# Patient Record
Sex: Male | Born: 1945 | Race: Black or African American | Hispanic: No | Marital: Married | Smoking: Never smoker
Health system: Southern US, Community
[De-identification: ages and names within clinical notes are randomized; demographics above are authoritative.]

## PROBLEM LIST (undated history)

## (undated) DIAGNOSIS — I10 Essential (primary) hypertension: Secondary | ICD-10-CM

## (undated) DIAGNOSIS — G4730 Sleep apnea, unspecified: Secondary | ICD-10-CM

## (undated) HISTORY — DX: Essential (primary) hypertension: I10

## (undated) HISTORY — DX: Sleep apnea, unspecified: G47.30

---

## 2007-05-20 ENCOUNTER — Ambulatory Visit (HOSPITAL_BASED_OUTPATIENT_CLINIC_OR_DEPARTMENT_OTHER): Admission: RE | Admit: 2007-05-20 | Discharge: 2007-05-20 | Payer: Self-pay | Admitting: Internal Medicine

## 2007-05-24 ENCOUNTER — Ambulatory Visit: Payer: Self-pay | Admitting: Internal Medicine

## 2008-03-23 ENCOUNTER — Ambulatory Visit (HOSPITAL_COMMUNITY): Admission: RE | Admit: 2008-03-23 | Discharge: 2008-03-24 | Payer: Self-pay | Admitting: Otolaryngology

## 2010-10-23 NOTE — Op Note (Signed)
NAME:  Mark Charles, Mark Charles                   ACCOUNT NO.:  [id number]   MEDICAL RECORD NO.:  [id number]          PATIENT TYPE:  OIB   LOCATION:  3307                         FACILITY:  [facility]   PHYSICIAN:  Kinnie Scales. Annalee Genta, M.D.DATE OF BIRTH:  [birth year]   DATE OF PROCEDURE:  03/23/2008  DATE OF DISCHARGE:                               OPERATIVE REPORT   PREOPERATIVE DIAGNOSES:  1. 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  2. Turbinate hypertrophy.  3. Severe obstructive sleep apnea.   POSTOPERATIVE DIAGNOSES:  1. 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  2. Turbinate hypertrophy.  3. Severe obstructive sleep apnea.   INDICATIONS FOR SURGERY:  1. 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  2. Turbinate hypertrophy.  3. Severe obstructive sleep apnea.   SURGICAL PROCEDURES:  1. Nasal septoplasty.  2. Bilateral inferior turbinate reduction.   SURGEON:  Kinnie Scales. Annalee Genta, MD   ANESTHESIA:  General endotracheal.   COMPLICATIONS:  None.   ESTIMATED BLOOD LOSS:  Approximately 50 mL.   The patient was transferred from the operating room to the recovery room  in stable condition.   BRIEF HISTORY:  The patient is a 65 year old black male who is referred  for evaluation of progressive symptoms of nasal airway obstruction.  The  patient has severe obstructive sleep apnea and attempts to wear CPAP on  a frequent basis.  Because of his nasal airway, he is unable to  adequately tolerate CPAP.  He complains of progressive symptoms of nasal  airway congestion and obstruction.  Examination in the office revealed a  severely deviated septum with left inferior septal spurring and large  turbinate hypertrophy.  Given his history and physical examination, I  recommended that he undertake septoplasty and bilateral turbinate  reduction under general anesthesia as an outpatient at [facility] OR.  With the patient's sleep apnea diagnosis, he was  scheduled for overnight observation in the Step-Down Unit for monitoring  of sleep apnea complications.  The patient understood and concurred with  our plan for surgery, which was scheduled as above.   PROCEDURE:  The patient was brought to the operating room on March 23, 2008, and placed in supine position on the operating table.  General  endotracheal anesthesia was established without difficulty.  When the  patient was adequately anesthetized, nose was examined and then injected  with a total of 7 mL of 1% lidocaine and 1:100,000 solution epinephrine  was injected in a submucosal fashion along the nasal septum and inferior  turbinates bilaterally.  The patient's nose was then packed with Afrin-  soaked cottonoid pledgets, and he was prepped and draped and positioned  on the operating table.   Procedure was begun by creating a right anterior hemitransfixion  incision.  This was carried through the mucosa underlying submucosa, and  a mucoperichondrial flap was elevated along the right-hand side.  Deviated bone and cartilage were crossed the midline, and a  mucoperiosteal flap was elevated along the patient's left-hand side.  With the mid septal cartilage freed of the overlying soft tissue,  it was  removed and preserved posterior bony septum and was partially resected  to bring the septum at the midline.  There was a large inferior septal  spur on the left-hand side, which was elevated using a 4-mm osteotome.  Overlying mucosa was preserved.  Anterior dorsal and columellar  cartilage was not deviated and this left intact.  The removed mid septal  cartilage was morcellized and returned to the mucoperichondrial pocket  at the conclusion of the procedure.  Mucoperichondrial flaps  reapproximated with a 4-0 gut suture on a Keith needle in a horizontal  mattress fashion.  At the conclusion of the procedure, bilateral Doyle  nasal septal splints were placed at the application of Bactroban   ointment and were sutured in position with a 3-0 Ethibond suture.   Bilateral inferior turbinate reduction was then performed with cautery  set at 12 W.  Bipolar intramural cautery was performed when the  turbinates had been adequately cauterized.  They were outfractured.  Small anterior incisions were created in each inferior turbinate and a  turbinate bone was resected preserving the overlying turbinate mucosa.  The patient's nasal cavity was then irrigated and suctioned.  Orogastric  tube was passed.  Stomach contents were aspirated.  The patient was then  awakened from his anesthetic, extubated, and transferred from the  operating room to recovery room in stable condition.  There were no  complications.  Blood loss was approximately 50 mL.           ______________________________  Kinnie Scales. Annalee Genta, M.D.     DLS/MEDQ  D:  24/40/1027  T:  03/23/2008  Job:  [id number]

## 2010-10-23 NOTE — Procedures (Signed)
NAME:  Mark Charles, Mark Charles                   ACCOUNT NO.:  [id number]   MEDICAL RECORD NO.:  [id number]          PATIENT TYPE:  OUT   LOCATION:  SLEEP CENTER                 FACILITY:  [facility]   PHYSICIAN:  Clinton D. Maple Hudson, MD, FCCP, FACPDATE OF BIRTH:  May 09, 1946   DATE OF STUDY:  05/20/2007                            NOCTURNAL POLYSOMNOGRAM   REFERRING PHYSICIAN:  Fleet Contras, MD   SERVICE:  Sleep Center.   INDICATIONS FOR STUDY:  Hypersomnia with sleep apnea.  Epworth  Sleepiness Score 17-24.  Body mass index 34.9.  Weight 247 pounds.  Height 70.5 inches.   HOME MEDICATIONS:  Medications listed and reviewed.   SLEEP ARCHITECTURE:  The patient met criteria for split protocol CPAP  titration based on severe sleep apnea with oxygen desaturation.  During  the diagnostic phase, total sleep time was 280 minutes with sleep  efficiency of 71%.  Stage I was 11%, stage II 68%, stage III absent, REM  20% of total sleep time.  Sleep latency was 14 minutes.  REM latency was  57 minutes.  Awake after sleep onset 87 minutes.  Arousal index 22.9.  Flomax and Caduet were taken at 8:45 p.m.   RESPIRATORY DATA:  Split study protocol.  Apnea-hypopnea index (AHI) 77  obstructive events per hour, indicating severe obstructive sleep  apnea/hypopnea syndrome before CPAP.  This included 28 obstructive  apneas, one central apnea and 127 hypopneas before CPAP.  Events were  not positional.  CPAP was titrated to 14 CWP, AHI zero per hour.  The  medium Mirage Quattro full-face mask was used with a heated humidifier.   OXYGEN DATA:  Moderately loud snoring, with oxygen desaturation with  nadir of 67%.  After CPAP titration, oxygen saturation held at 93.6% on  room air.   CARDIAC DATA:  Sinus rhythm, ST depression and T-wave inversion,  frequent premature atrial contractions.  Movement/parasomnia:  No  significant movement disturbance.  Bathroom times two.   IMPRESSION/RECOMMENDATIONS:  1. Severe obstructive  sleep apnea/hypopnea syndrome, AHI 77 per hour      with nonpositional events, moderately loud snoring and oxygen      desaturation to a nadir of 68%.  2. Successful CPAP titration to 14 CWP, AHI zero per hour.  A medium      Quattro full-face mask was used with heated humidifier.      Clinton D. Maple Hudson, MD, FCCP, FACP  Diplomate, Biomedical engineer of Sleep Medicine  Electronically Signed     CDY/MEDQ  D:  05/24/2007 11:00:19  T:  05/25/2007 11:16:20  Job:  [id number]

## 2011-03-12 LAB — GLUCOSE, CAPILLARY
Glucose-Capillary: 124 — ABNORMAL HIGH
Glucose-Capillary: 138 — ABNORMAL HIGH
Glucose-Capillary: 157 — ABNORMAL HIGH

## 2011-03-12 LAB — BASIC METABOLIC PANEL
BUN: 6
Calcium: 9.1
Chloride: 102
GFR calc non Af Amer: >60
Sodium: 137

## 2011-03-12 LAB — CBC
HCT: 45.3
Platelets: 218
RBC: 4.72
WBC: 6.4

## 2012-12-23 ENCOUNTER — Encounter: Payer: Self-pay | Admitting: Cardiology

## 2013-01-26 ENCOUNTER — Ambulatory Visit: Payer: Self-pay | Admitting: Cardiovascular Disease

## 2013-03-08 ENCOUNTER — Encounter: Payer: Self-pay | Admitting: *Deleted

## 2013-03-08 ENCOUNTER — Ambulatory Visit (INDEPENDENT_AMBULATORY_CARE_PROVIDER_SITE_OTHER): Payer: Medicare Other | Admitting: Cardiovascular Disease

## 2013-03-08 ENCOUNTER — Encounter: Payer: Self-pay | Admitting: Cardiovascular Disease

## 2013-03-08 ENCOUNTER — Other Ambulatory Visit: Payer: Self-pay | Admitting: *Deleted

## 2013-03-08 VITALS — BP 160/89 | HR 56 | Ht 70.0 in | Wt 271.0 lb

## 2013-03-08 DIAGNOSIS — I10 Essential (primary) hypertension: Secondary | ICD-10-CM

## 2013-03-08 DIAGNOSIS — G4730 Sleep apnea, unspecified: Secondary | ICD-10-CM

## 2013-03-08 DIAGNOSIS — E785 Hyperlipidemia, unspecified: Secondary | ICD-10-CM

## 2013-03-08 DIAGNOSIS — R9431 Abnormal electrocardiogram [ECG] [EKG]: Secondary | ICD-10-CM

## 2013-03-08 MED ORDER — LISINOPRIL 10 MG PO TABS: 10.0000 mg | ORAL_TABLET | Freq: Every day | ORAL | Status: DC

## 2013-03-08 NOTE — Patient Instructions (Signed)
   Begin Lisinopril 10mg  daily - new sent to pharm Continue all other medications.   Your physician has requested that you have a lexiscan myoview. For further information please visit [email or web address] Please follow instruction sheet, as given. Office will contact with results via phone or letter.   Your physician has requested that you regularly monitor and record your blood pressure readings at home.  Please check at varying times of the day over the next month.  Bring your readings to your next visit for MD review.   Follow up in  1 month

## 2013-03-08 NOTE — Progress Notes (Addendum)
Patient ID: Mark Charles, male   DOB: 1946/05/17, 67 y.o.   MRN: [id number]    CARDIOLOGY CONSULT NOTE  Patient ID: Mark Charles MRN: [id number] DOB/AGE: 11/08/1945 67 y.o.  Primary Physician Ignatius Specking., MD  Reason for Consultation: abnormal ECG  HPI: Mark Charles is a 67 year old gentleman with a PMH significant for hyperlipidemia. On 12-19-12, his LDL was 76, TC 143, TG 181, and HDL 31 (on simvastatin). He apparently had an abnormal ECG at that time, and an echocardiogram was to be performed. An ECG today shows NSR, 60 bpm, with a nonspecific T wave abnormality in II, III, and aVF, with T wave inversions in V4-6. He had normal renal function on 12-19-12 as well.  He has obstructive sleep apnea and uses CPAP.  He lost 37 lbs when he was first diagnosed with type II diabetes mellitus which is diet-controlled. He's been trying to walk but has bilateral knee arthritis. He takes ibuprofen for this. He takes metoprolol for hypertension, and had been on Benicar in the past. He's been noted to have elevated BP readings at various times.  He denies chest pain and shortness of breath, palpitations, and dizziness. He has had ankle swelling in the past, and attributes this to high-dose Benicar.  His echocardiogram report is unavailable to me at this time.    FamHx: brother had MI at age 96. Another brother died at 47 of an MI. History of strokes in the family. His father invented a medicine which removed corns from feet. He has part Native American ancestry.   SocHx: raised in [place], [place]. He's also lived in [place] and [place], [place]. Married with 3 children. He's never done drugs. Was a high school athlete. Denies smoking. He attended [employer].  Allergies  Allergen Reactions  . Lipitor [Atorvastatin] Other (See Comments)    Aching joints    Current Outpatient Prescriptions  Medication Sig Dispense Refill  . aspirin 81 MG chewable tablet Chew 81 mg by mouth daily.      .  fluocinonide cream (LIDEX) 0.05 % Apply topically 2 (two) times daily.      Marland Kitchen ibuprofen (ADVIL,MOTRIN) 800 MG tablet Take 800 mg by mouth 2 (two) times daily.      . metoprolol succinate (TOPROL-XL) 50 MG 24 hr tablet Take 50 mg by mouth daily. Take with or immediately following a meal.      . saw palmetto 160 MG capsule Take 160 mg by mouth 2 (two) times daily.      . sildenafil (VIAGRA) 100 MG tablet Take 100 mg by mouth daily as needed for erectile dysfunction.      . simvastatin (ZOCOR) 40 MG tablet Take 40 mg by mouth every evening.      . tamsulosin (FLOMAX) 0.4 MG CAPS Take 0.4 mg by mouth daily after supper.      . triamcinolone cream (KENALOG) 0.1 % Apply topically 2 (two) times daily.       No current facility-administered medications for this visit.    Past Medical History  Diagnosis Date  . Sleep apnea   . HTN (hypertension)     No past surgical history on file.  History   Social History  . Marital Status: Married    Spouse Name: N/A    Number of Children: N/A  . Years of Education: N/A   Occupational History  . Not on file.   Social History Main Topics  . Smoking status: Never Smoker   .  Smokeless tobacco: Not on file  . Alcohol Use: Yes     Comment: occasional  . Drug Use: Not on file  . Sexual Activity: Not on file   Other Topics Concern  . Not on file   Social History Narrative  . No narrative on file     Family History  Problem Relation Age of Onset  . Stroke Mother   . Stroke Father      Prior to Admission medications   Medication Sig Start Date End Date Taking? Authorizing Provider  aspirin 81 MG chewable tablet Chew 81 mg by mouth daily.    Historical Provider, MD  fluocinonide cream (LIDEX) 0.05 % Apply topically 2 (two) times daily.    Historical Provider, MD  ibuprofen (ADVIL,MOTRIN) 800 MG tablet Take 800 mg by mouth 2 (two) times daily.    Historical Provider, MD  metoprolol succinate (TOPROL-XL) 50 MG 24 hr tablet Take 50 mg by mouth  daily. Take with or immediately following a meal.    Historical Provider, MD  saw palmetto 160 MG capsule Take 160 mg by mouth 2 (two) times daily.    Historical Provider, MD  sildenafil (VIAGRA) 100 MG tablet Take 100 mg by mouth daily as needed for erectile dysfunction.    Historical Provider, MD  simvastatin (ZOCOR) 40 MG tablet Take 40 mg by mouth every evening.    Historical Provider, MD  tamsulosin (FLOMAX) 0.4 MG CAPS Take 0.4 mg by mouth daily after supper.    Historical Provider, MD  triamcinolone cream (KENALOG) 0.1 % Apply topically 2 (two) times daily.    Historical Provider, MD     Review of systems complete and found to be negative unless listed above in HPI  BP: 160/89  Pulse: 56    Physical exam Height 5\' 10"  (1.778 m), weight 271 lb (122.925 kg). General: NAD Neck: No JVD, no thyromegaly or thyroid nodule.  Lungs: Clear to auscultation bilaterally with normal respiratory effort. CV: Nondisplaced PMI.  Heart regular rhythm with premature contractions noted, with normal S1/S2, no S3/S4, no murmur.  No peripheral edema.  No carotid bruit.  Normal pedal pulses.  Abdomen: Soft, nontender, no hepatosplenomegaly, no distention.  Skin: Intact without lesions or rashes.  Neurologic: Alert and oriented x 3.  Psych: Normal affect. Extremities: No clubbing or cyanosis.  HEENT: Normal.   Labs:   Lab Results  Component Value Date   WBC 6.4 03/22/2008   HGB 15.3 03/22/2008   HCT 45.3 03/22/2008   MCV 95.9 03/22/2008   PLT 218 03/22/2008   No results found for this basename: NA, K, CL, CO2, BUN, CREATININE, CALCIUM, LABALBU, PROT, BILITOT, ALKPHOS, ALT, AST, GLUCOSE,  in the last 168 hours No results found for this basename: CKTOTAL, CKMB, CKMBINDEX, TROPONINI    No results found for this basename: CHOL   No results found for this basename: HDL   No results found for this basename: LDLCALC   No results found for this basename: TRIG   No results found for this  basename: CHOLHDL   No results found for this basename: LDLDIRECT        Studies: No results found.  ASSESSMENT AND PLAN:  1. Abnormal ECG: he is asymptomatic at present. His precordial T wave inversions are concerning for ischemia. He apparently had a cardiac catheterization roughly 15 years ago (report unavailable) which was reportedly normal. His echocardiogram revealed normal LV systolic function, EF 50-55%, with mild LVH. He is concerned he may have silent  symptoms. I will obtain a Lexiscan Cardiolite stress test. 2. HTN: uncontrolled. He is presently on metoprolol and his HR is 56 bpm. I will start lisinopril 10 mg daily and reduce his metoprolol succinate to 25 mg q am. I have asked the patient to check blood pressure readings 4-5 times per week, at different times throughout the day, in order to get a better approximation of mean BP values. These results will be provided to me at the end of that period so that I can determine if antihypertensive medication titration is indicated. He has both obesity and sleep apnea, which are risk factors for this, but he does apparently use CPAP. 3. Hyperlipidemia: lipids within normal limits in July 2014, on sivmastatin 40 mg daily.    Signed: Prentice Docker, M.D., F.A.C.C.  03/08/2013, 8:51 AM

## 2013-03-17 ENCOUNTER — Encounter (HOSPITAL_COMMUNITY): Payer: Self-pay

## 2013-03-17 ENCOUNTER — Encounter (HOSPITAL_COMMUNITY)
Admission: RE | Admit: 2013-03-17 | Discharge: 2013-03-17 | Disposition: A | Discharge status: Home / Self Care | Payer: Medicare Other | Source: Ambulatory Visit | Attending: Cardiovascular Disease | Admitting: Cardiovascular Disease

## 2013-03-17 DIAGNOSIS — R0789 Other chest pain: Secondary | ICD-10-CM | POA: Insufficient documentation

## 2013-03-17 DIAGNOSIS — R9431 Abnormal electrocardiogram [ECG] [EKG]: Secondary | ICD-10-CM

## 2013-03-17 DIAGNOSIS — I2510 Atherosclerotic heart disease of native coronary artery without angina pectoris: Secondary | ICD-10-CM | POA: Insufficient documentation

## 2013-03-17 DIAGNOSIS — G4730 Sleep apnea, unspecified: Secondary | ICD-10-CM

## 2013-03-17 DIAGNOSIS — R51 Headache: Secondary | ICD-10-CM | POA: Insufficient documentation

## 2013-03-17 DIAGNOSIS — E785 Hyperlipidemia, unspecified: Secondary | ICD-10-CM

## 2013-03-17 MED ORDER — TECHNETIUM TC 99M SESTAMIBI GENERIC - CARDIOLITE
30.0000 | Freq: Once | INTRAVENOUS | Status: AC | PRN
  Administered 2013-03-17: 30 via INTRAVENOUS

## 2013-03-17 MED ORDER — SODIUM CHLORIDE 0.9 % IJ SOLN
INTRAMUSCULAR | Status: AC
  Administered 2013-03-17: 10 mL via INTRAVENOUS
  Filled 2013-03-17: qty 10

## 2013-03-17 MED ORDER — TECHNETIUM TC 99M SESTAMIBI - CARDIOLITE
10.0000 | Freq: Once | INTRAVENOUS | Status: AC | PRN
  Administered 2013-03-17: 09:00:00 10 via INTRAVENOUS

## 2013-03-17 MED ORDER — REGADENOSON 0.4 MG/5ML IV SOLN
INTRAVENOUS | Status: AC
  Administered 2013-03-17: 0.4 mg via INTRAVENOUS
  Filled 2013-03-17: qty 5

## 2013-03-17 NOTE — Progress Notes (Signed)
Stress Lab Nurses Notes - Mark Charles  Mark Charles 03/17/2013 Reason for doing test: Abnormal ECG Type of test: [name] Nurse performing test: [name], RN Nuclear Medicine Tech: [name] Echo Tech: Not Applicable MD performing test: Dr. Wyline Mood / Jacolyn Reedy PA Family MD: Dr. Sherril Croon Test explained and consent signed: yes IV started: 22g jelco, Saline lock flushed, No redness or edema and Saline lock started in radiology Symptoms: chest pressure & headache Treatment/Intervention: None Reason test stopped: protocol completed After recovery IV was: Discontinued via X-ray tech and No redness or edema Patient to return to Nuc. Med at : 11:45 Patient discharged: Home Patient's Condition upon discharge was: stable Comments: During test BP 183/85 & HR 107.  Recovery BP 169/98 & HR 93.  Symptoms resolved in recovery. [name] T

## 2013-03-24 ENCOUNTER — Encounter: Payer: Self-pay | Admitting: *Deleted

## 2013-04-08 ENCOUNTER — Ambulatory Visit (INDEPENDENT_AMBULATORY_CARE_PROVIDER_SITE_OTHER): Payer: Medicare Other | Admitting: Cardiovascular Disease

## 2013-04-08 ENCOUNTER — Encounter: Payer: Self-pay | Admitting: Cardiovascular Disease

## 2013-04-08 VITALS — BP 174/82 | HR 51 | Ht 70.5 in | Wt 269.0 lb

## 2013-04-08 DIAGNOSIS — R9431 Abnormal electrocardiogram [ECG] [EKG]: Secondary | ICD-10-CM

## 2013-04-08 DIAGNOSIS — J302 Other seasonal allergic rhinitis: Secondary | ICD-10-CM

## 2013-04-08 DIAGNOSIS — Z136 Encounter for screening for cardiovascular disorders: Secondary | ICD-10-CM

## 2013-04-08 DIAGNOSIS — R0600 Dyspnea, unspecified: Secondary | ICD-10-CM

## 2013-04-08 DIAGNOSIS — E669 Obesity, unspecified: Secondary | ICD-10-CM

## 2013-04-08 DIAGNOSIS — J309 Allergic rhinitis, unspecified: Secondary | ICD-10-CM

## 2013-04-08 DIAGNOSIS — G4730 Sleep apnea, unspecified: Secondary | ICD-10-CM

## 2013-04-08 DIAGNOSIS — R0609 Other forms of dyspnea: Secondary | ICD-10-CM

## 2013-04-08 DIAGNOSIS — I10 Essential (primary) hypertension: Secondary | ICD-10-CM

## 2013-04-08 DIAGNOSIS — R062 Wheezing: Secondary | ICD-10-CM

## 2013-04-08 DIAGNOSIS — E785 Hyperlipidemia, unspecified: Secondary | ICD-10-CM

## 2013-04-08 DIAGNOSIS — IMO0001 Reserved for inherently not codable concepts without codable children: Secondary | ICD-10-CM

## 2013-04-08 MED ORDER — LISINOPRIL 20 MG PO TABS: 20.0000 mg | ORAL_TABLET | Freq: Every day | ORAL | Status: DC

## 2013-04-08 MED ORDER — METOPROLOL SUCCINATE ER 50 MG PO TB24: 50.0000 mg | ORAL_TABLET | Freq: Every morning | ORAL | Status: AC

## 2013-04-08 NOTE — Progress Notes (Signed)
Patient ID: Mark Charles, male   DOB: 1945/07/12, 67 y.o.   MRN: [id number]      SUBJECTIVE: Mark Charles is here to f/u on the results of cardiac testing. His nuclear stress test revealed no perfusion defects, LVEF 68%. He does admit to being inactive and wants to start exercising at the [facility]. He is compliant with CPAP.  He denies chest pain. He has heard himself wheeze and has seasonal allergies, but has never had allergy testing per se. He continues to take Toprol XL 50 mg bid in spite of my request to reduce the dose at his last visit, as I had started lisinopril. He denies lightheadedness and dizziness.    Allergies  Allergen Reactions  . Lipitor [Atorvastatin] Other (See Comments)    Aching joints    Current Outpatient Prescriptions  Medication Sig Dispense Refill  . aspirin 81 MG chewable tablet Chew 81 mg by mouth daily.      Marland Kitchen ibuprofen (ADVIL,MOTRIN) 800 MG tablet Take 800 mg by mouth 2 (two) times daily as needed.       Marland Kitchen lisinopril (PRINIVIL,ZESTRIL) 10 MG tablet Take 1 tablet (10 mg total) by mouth daily.  30 tablet  6  . metoprolol succinate (TOPROL-XL) 50 MG 24 hr tablet Take 50 mg by mouth 2 (two) times daily. Take with or immediately following a meal.      . NON FORMULARY as directed. CPAP      . simvastatin (ZOCOR) 40 MG tablet Take 40 mg by mouth every evening.      . triamcinolone cream (KENALOG) 0.1 % Apply topically 2 (two) times daily.      . fluocinonide cream (LIDEX) 0.05 % Apply topically 2 (two) times daily.      . sildenafil (VIAGRA) 100 MG tablet Take 100 mg by mouth daily as needed for erectile dysfunction.      . tamsulosin (FLOMAX) 0.4 MG CAPS Take 0.4 mg by mouth daily after supper.       No current facility-administered medications for this visit.    Past Medical History  Diagnosis Date  . Sleep apnea   . HTN (hypertension)     No past surgical history on file.  History   Social History  . Marital Status: Married    Spouse Name: Mark Charles    Number  of Children: Mark Charles  . Years of Education: Mark Charles   Occupational History  . Not on file.   Social History Main Topics  . Smoking status: Never Smoker   . Smokeless tobacco: Never Used  . Alcohol Use: Yes     Comment: occasional  . Drug Use: Not on file  . Sexual Activity: Not on file   Other Topics Concern  . Not on file   Social History Narrative  . No narrative on file     Filed Vitals:   04/08/13 0854  BP: 174/82  Pulse: 51  Height: 5' 10.5" (1.791 m)  Weight: 269 lb (122.018 kg)    PHYSICAL EXAM General: NAD, obese Neck: No JVD, no thyromegaly or thyroid nodule.  Lungs: Clear to auscultation bilaterally with normal respiratory effort. CV: Nondisplaced PMI.  Heart regular S1/S2, no S3/S4, no murmur.  No peripheral edema.  No carotid bruit.  Normal pedal pulses.  Abdomen: Soft, nontender, no hepatosplenomegaly, no distention.  Neurologic: Alert and oriented x 3.  Psych: Normal affect. Extremities: No clubbing or cyanosis.   ECG: reviewed and available in electronic records.      ASSESSMENT  AND PLAN: 1. Abnormal ECG: he continues to remain asymptomatic. He apparently had a cardiac catheterization roughly 15 years ago (report unavailable) which was reportedly normal. His echocardiogram revealed normal LV systolic function, EF 50-55%, with mild LVH. His Lexiscan Cardiolite stress test was also normal.  2. HTN: uncontrolled. He is presently on metoprolol succinate 50 mg bid and his HR is 51 bpm. I will increase lisinopril to 20 mg daily and reduce his metoprolol succinate to 50 mg q am. I have asked the patient to check blood pressure readings 4-5 times per week, at different times throughout the day, in order to get a better approximation of mean BP values. These results will be provided to me at the end of that period so that I can determine if antihypertensive medication titration is indicated. He forgot to bring in his record today, but says his SBP runs as high as 170  mmHg at home. He has both obesity and sleep apnea, which are risk factors for this, but he does apparently use CPAP.  3. Hyperlipidemia: lipids within normal limits in July 2014, on sivmastatin 40 mg daily.  4. Obesity: I've strongly encouraged increased physical activity, and he is in agreement with this plan. 5. Wheezing: he may have an obstructive component related to asthmatic bronchitis due to allergies. I will obtain PFT's. He may need allergy testing.  Dispo: f/u 3-4 months.      Prentice Docker, M.D., F.A.C.C.

## 2013-04-08 NOTE — Patient Instructions (Signed)
Your physician has recommended that you have a pulmonary function test. Pulmonary Function Tests are a group of tests that measure how well air moves in and out of your lungs. Office will contact with results via phone or letter.  . Increase Lisinopril to 20mg  daily - new sent to pharm Decrease Toprol XL 50mg  to every morning only  Continue all other medications.   Your physician has requested that you regularly monitor and record your blood pressure readings at home.  Please mail/bring to office when complete for review by MD. Follow up in  3 months

## 2013-04-28 ENCOUNTER — Ambulatory Visit (HOSPITAL_COMMUNITY)
Admission: RE | Admit: 2013-04-28 | Discharge: 2013-04-28 | Disposition: A | Discharge status: Home / Self Care | Payer: Medicare Other | Source: Ambulatory Visit | Attending: Cardiovascular Disease | Admitting: Cardiovascular Disease

## 2013-04-28 DIAGNOSIS — R0602 Shortness of breath: Secondary | ICD-10-CM | POA: Insufficient documentation

## 2013-04-28 DIAGNOSIS — R0600 Dyspnea, unspecified: Secondary | ICD-10-CM

## 2013-04-28 DIAGNOSIS — R062 Wheezing: Secondary | ICD-10-CM

## 2013-04-28 DIAGNOSIS — J302 Other seasonal allergic rhinitis: Secondary | ICD-10-CM

## 2013-04-28 LAB — PULMONARY FUNCTION TEST
DLCO cor % pred: 68 %
DLCO cor: 22.76 ml/min/mmHg
DLCO unc % pred: 68 %
DLCO unc: 22.76 ml/min/mmHg
FEF 25-75 Post: 1.6 L/sec
FEF 25-75 Pre: 2.64 L/sec
FEF2575-%Pred-Pre: 96 %
FEV1-Post: 1.99 L
FEV1-Pre: 2.32 L
FEV6-%Change-Post: -4 %
FEV6-%Pred-Post: 65 %
FEV6-Post: 2.51 L
FEV6-Pre: 2.62 L
FEV6FVC-%Pred-Post: 104 %
FEV6FVC-%Pred-Pre: 104 %
FVC-%Change-Post: -4 %
FVC-%Pred-Post: 62 %
Post FEV6/FVC ratio: 100 %
RV: 1.96 L
TLC % pred: 60 %

## 2013-04-28 MED ORDER — ALBUTEROL SULFATE (5 MG/ML) 0.5% IN NEBU
2.5000 mg | INHALATION_SOLUTION | Freq: Once | RESPIRATORY_TRACT | Status: AC
  Administered 2013-04-28: 2.5 mg via RESPIRATORY_TRACT

## 2013-04-30 NOTE — Procedures (Signed)
NAME:  Mark Charles, Mark Charles                   ACCOUNT NO.:  [id number]  MEDICAL RECORD NO.:  [id number]  LOCATION:  RESP                          FACILITY:  [facility]  PHYSICIAN:  Jake Fuhrmann L. Juanetta Gosling, M.D.DATE OF BIRTH:  1945/11/10  DATE OF PROCEDURE: DATE OF DISCHARGE:  04/28/2013                           PULMONARY FUNCTION TEST   Reason for pulmonary function testing is shortness of breath.  1. Spirometry shows moderate ventilatory defect without evidence of     airflow obstruction. 2. Lung volumes show reduction in total lung capacity suggesting a     primary restrictive lung disease. 3. DLCO is mildly reduced. 4. Airway resistance is normal. 5. There is no significant bronchodilator improvement. 6. Noting, the patient's height and weight, there may be a primary     restrictive which is related to obesity.     Arjun Hard L. Juanetta Gosling, M.D.     ELH/MEDQ  D:  04/29/2013  T:  04/30/2013  Job:  [id number]  cc:   Dr. Prentice Docker

## 2013-05-17 ENCOUNTER — Telehealth: Payer: Self-pay | Admitting: *Deleted

## 2013-05-17 NOTE — Telephone Encounter (Signed)
Message copied by Lesle Chris on Mon May 17, 2013  2:32 PM ------      Message from: Prentice Docker A      Created: Fri May 07, 2013  2:01 PM       Would have pt f/u with PCP with respect to this. ------

## 2013-05-17 NOTE — Telephone Encounter (Signed)
Notes Recorded by Lesle Chris, LPN on 91/09/7827 at 2:31 PM Patient notified and verbalized understanding. States he has OV with PMD in the next month, he will discuss with him further at this time. ------ Notes Recorded by Lesle Chris, LPN on 56/07/1306 at 1:03 PM Left message to return call.

## 2013-07-07 ENCOUNTER — Ambulatory Visit (INDEPENDENT_AMBULATORY_CARE_PROVIDER_SITE_OTHER): Payer: Medicare Other | Admitting: Cardiovascular Disease

## 2013-07-07 ENCOUNTER — Encounter: Payer: Self-pay | Admitting: Cardiovascular Disease

## 2013-07-07 VITALS — BP 159/93 | HR 60 | Ht 70.5 in | Wt 262.0 lb

## 2013-07-07 DIAGNOSIS — E785 Hyperlipidemia, unspecified: Secondary | ICD-10-CM

## 2013-07-07 DIAGNOSIS — E669 Obesity, unspecified: Secondary | ICD-10-CM

## 2013-07-07 DIAGNOSIS — IMO0001 Reserved for inherently not codable concepts without codable children: Secondary | ICD-10-CM

## 2013-07-07 DIAGNOSIS — R0609 Other forms of dyspnea: Secondary | ICD-10-CM

## 2013-07-07 DIAGNOSIS — R0600 Dyspnea, unspecified: Secondary | ICD-10-CM

## 2013-07-07 DIAGNOSIS — G4730 Sleep apnea, unspecified: Secondary | ICD-10-CM

## 2013-07-07 DIAGNOSIS — Z136 Encounter for screening for cardiovascular disorders: Secondary | ICD-10-CM

## 2013-07-07 DIAGNOSIS — I10 Essential (primary) hypertension: Secondary | ICD-10-CM

## 2013-07-07 DIAGNOSIS — R0989 Other specified symptoms and signs involving the circulatory and respiratory systems: Secondary | ICD-10-CM

## 2013-07-07 DIAGNOSIS — R9431 Abnormal electrocardiogram [ECG] [EKG]: Secondary | ICD-10-CM

## 2013-07-07 MED ORDER — LOSARTAN POTASSIUM 25 MG PO TABS: 25.0000 mg | ORAL_TABLET | Freq: Every day | ORAL | Status: DC

## 2013-07-07 NOTE — Progress Notes (Signed)
Patient ID: Mark Charles, male   DOB: 16-Nov-1945, 68 y.o.   MRN: [id number]      SUBJECTIVE: The patient feels well and denies chest pain and shortness of breath. He still has not begun to exercise. Both his primary care physician, Dr. Sherril Croon, and his daughter have been encouraging him to get into a routine now that he is retired.    Allergies  Allergen Reactions  . Lipitor [Atorvastatin] Other (See Comments)    Aching joints    Current Outpatient Prescriptions  Medication Sig Dispense Refill  . aspirin 81 MG chewable tablet Chew 81 mg by mouth daily.      Marland Kitchen ibuprofen (ADVIL,MOTRIN) 800 MG tablet Take 800 mg by mouth 2 (two) times daily as needed.       Marland Kitchen lisinopril (PRINIVIL,ZESTRIL) 20 MG tablet Take 10 mg by mouth daily.      . metFORMIN (GLUCOPHAGE) 500 MG tablet Take 500 mg by mouth daily with breakfast.      . metoprolol succinate (TOPROL-XL) 50 MG 24 hr tablet Take 1 tablet (50 mg total) by mouth every morning. Take with or immediately following a meal.      . NON FORMULARY as directed. CPAP      . sildenafil (VIAGRA) 100 MG tablet Take 100 mg by mouth daily as needed for erectile dysfunction.      . simvastatin (ZOCOR) 40 MG tablet Take 40 mg by mouth every evening.       No current facility-administered medications for this visit.    Past Medical History  Diagnosis Date  . Sleep apnea   . HTN (hypertension)     No past surgical history on file.  History   Social History  . Marital Status: Married    Spouse Name: N/A    Number of Children: N/A  . Years of Education: N/A   Occupational History  . Not on file.   Social History Main Topics  . Smoking status: Never Smoker   . Smokeless tobacco: Never Used  . Alcohol Use: Yes     Comment: occasional  . Drug Use: Not on file  . Sexual Activity: Not on file   Other Topics Concern  . Not on file   Social History Narrative  . No narrative on file     Filed Vitals:   07/07/13 0944  BP: 159/93  Pulse: 60    Height: 5' 10.5" (1.791 m)  Weight: 262 lb (118.842 kg)  SpO2: 96%    PHYSICAL EXAM General: NAD Neck: No JVD, no thyromegaly or thyroid nodule.  Lungs: Clear to auscultation bilaterally with normal respiratory effort. CV: Nondisplaced PMI.  Heart regular S1/S2, no S3/S4, no murmur.  No peripheral edema.  No carotid bruit.  Normal pedal pulses.  Abdomen: Soft, nontender, no hepatosplenomegaly, no distention.  Neurologic: Alert and oriented x 3.  Psych: Normal affect. Extremities: No clubbing or cyanosis.   ECG: reviewed and available in electronic records.      ASSESSMENT AND PLAN: 1. Abnormal ECG: he continues to remain asymptomatic. He apparently had a cardiac catheterization roughly 15-16 years ago (report unavailable) which was reportedly normal. His echocardiogram revealed normal LV systolic function, EF 50-55%, with mild LVH. His Lexiscan Cardiolite stress test was also normal.  2. HTN: uncontrolled with HR 60 bpm. He apparently developed a cough with lisinopril. I will discontinue this and start losartan 25 mg daily. He has both obesity and sleep apnea, which are risk factors for this, but he  does apparently use CPAP.  3. Hyperlipidemia: lipids within normal limits in July 2014, on sivmastatin 40 mg daily.  4. Obesity: I've strongly encouraged increased physical activity, and he is in agreement with this plan.  5. Wheezing: PFT's revealed restrictive lung disease.  Dispo: prn.    Prentice DockerSuresh Koneswaran, M.D., F.A.C.C.

## 2013-07-07 NOTE — Patient Instructions (Signed)
   Stop Lisinopril   Begin Losartan 25mg  daily - new sent to pharm Continue all other medications.   Follow up as needed

## 2014-02-07 ENCOUNTER — Other Ambulatory Visit: Payer: Self-pay | Admitting: *Deleted

## 2014-02-07 MED ORDER — LOSARTAN POTASSIUM 25 MG PO TABS: 25.0000 mg | ORAL_TABLET | Freq: Every day | ORAL | Status: AC

## 2014-09-23 IMAGING — NM NM MYOCAR SINGLE W/SPECT W/WALL MOTION & EF
1 series · 6 of 6 positions shown · non-contrast
Comparison: none

Ordering Physician: INGUNN HARPA RONLOR

Paura Physician: [REDACTED]al Data:  67 yo male with CAD risk factors and abnormal EKG
NUCLEAR MEDICINE STRESS MYOVIEW STUDY WITH SPECT AND LEFT
VENTRICULAR EJECTION FRACTION
Radionuclide Data: One-day rest/stress protocol performed with
[DATE] mCi of Zc-TTm sestamibi/cardiolite.
Stress Data: Lexiscan bolus was given in standard fashion. Heart
rate increased from 65 beats per minute up to 103 beats per minute,
and blood pressure increased from 161/105 up to 183/85 . The
patient
tolerated infusion well. No diagnostic ST-segment abnormalities
were noted, and there were no arrhythmias.
EKG: Baseline tracing shows sinus rhythm with PACs
Scintigraphic Data: Analysis of the raw perfusion data finds
adequate radiotracer uptake.
Tomographic views obtained using the short axis, vertical long
axis, and horizontal long axis planes. There were no myocardial
perfusion defects.
Gated imaging reveals an EDV of 104 ml , ESV of 33 ml, T I D ratio
0.89 ,
and LVEF 68%of with no wall motion abnormalities.

[cr cardiac tc low dose · 6.41mm/px · 6 of 64 frames shown]
[frame 6/64]
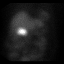
[frame 16/64]
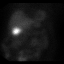
[frame 27/64]
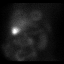
[frame 38/64]
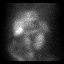
[frame 48/64]
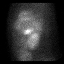
[frame 59/64]
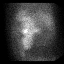

[6 of 6 positions shown; findings below may reference images not displayed]

IMPRESSION: Low risk study for obstructive coronary artery disease.
Low risk study for major cardiac events. No evidence of ischemia or
scar. LVEF 68%.
